# Patient Record
Sex: Female | Born: 1980 | Race: White | Hispanic: No | Marital: Single | Smoking: Former smoker
Health system: Southern US, Community
[De-identification: ages and names within clinical notes are randomized; demographics above are authoritative.]

## PROBLEM LIST (undated history)

## (undated) DIAGNOSIS — F419 Anxiety disorder, unspecified: Secondary | ICD-10-CM

## (undated) HISTORY — PX: OVARIAN CYST REMOVAL: SHX89

## (undated) HISTORY — PX: APPENDECTOMY: SHX54

---
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
## 2003-07-31 ENCOUNTER — Encounter: Admission: RE | Admit: 2003-07-31 | Discharge: 2003-07-31 | Payer: Self-pay | Admitting: Family Medicine

## 2015-03-17 ENCOUNTER — Other Ambulatory Visit: Payer: Self-pay | Admitting: Obstetrics and Gynecology

## 2015-05-28 ENCOUNTER — Other Ambulatory Visit (HOSPITAL_COMMUNITY): Payer: Self-pay | Admitting: Obstetrics and Gynecology

## 2015-05-28 DIAGNOSIS — O283 Abnormal ultrasonic finding on antenatal screening of mother: Secondary | ICD-10-CM

## 2015-05-28 DIAGNOSIS — Z3A19 19 weeks gestation of pregnancy: Secondary | ICD-10-CM

## 2015-05-28 DIAGNOSIS — Z3689 Encounter for other specified antenatal screening: Secondary | ICD-10-CM

## 2015-06-01 ENCOUNTER — Ambulatory Visit (HOSPITAL_COMMUNITY)
Admission: RE | Admit: 2015-06-01 | Discharge: 2015-06-01 | Disposition: A | Discharge status: Home / Self Care | Payer: BLUE CROSS/BLUE SHIELD | Source: Ambulatory Visit | Attending: Obstetrics and Gynecology | Admitting: Obstetrics and Gynecology

## 2015-06-01 ENCOUNTER — Encounter (HOSPITAL_COMMUNITY): Payer: Self-pay

## 2015-06-01 DIAGNOSIS — Z36 Encounter for antenatal screening of mother: Secondary | ICD-10-CM | POA: Insufficient documentation

## 2015-06-01 DIAGNOSIS — O283 Abnormal ultrasonic finding on antenatal screening of mother: Secondary | ICD-10-CM | POA: Insufficient documentation

## 2015-06-01 DIAGNOSIS — Z3A19 19 weeks gestation of pregnancy: Secondary | ICD-10-CM

## 2015-06-01 DIAGNOSIS — Z3689 Encounter for other specified antenatal screening: Secondary | ICD-10-CM

## 2015-06-01 HISTORY — DX: Anxiety disorder, unspecified: F41.9

## 2015-06-11 ENCOUNTER — Other Ambulatory Visit (HOSPITAL_COMMUNITY): Payer: Self-pay | Admitting: Obstetrics and Gynecology

## 2015-06-11 ENCOUNTER — Telehealth (HOSPITAL_COMMUNITY): Payer: Self-pay | Admitting: MS"

## 2015-06-11 DIAGNOSIS — Q6601 Congenital talipes equinovarus, right foot: Secondary | ICD-10-CM

## 2015-06-11 DIAGNOSIS — O283 Abnormal ultrasonic finding on antenatal screening of mother: Secondary | ICD-10-CM

## 2015-06-11 DIAGNOSIS — Z3A21 21 weeks gestation of pregnancy: Secondary | ICD-10-CM

## 2015-06-11 NOTE — Telephone Encounter (Signed)
Called Stacey Elliott to discuss her prenatal cell free DNA test results.  Mrs. Stacey Elliott had Panorama testing through [facility].  Testing was offered because of abnormal ultrasound results.   The patient was identified by name and DOB. We discussed that no results were able to be obtained due to borderline low fetal fraction and other analytic factors in the sample. Discussed option for redraw for NIPS and that success of obtaining result with re-draw is approximately 87% in this case. Reviewed that sample could be sent to the same lab or a different NIPS lab that uses different technology for analysis. Patient expressed concern that this would mean more waiting for results, and she doesn't think she can keep waiting. Reviewed option of amniocentesis including the benefit of it being a diagnostic test for chromosome conditions and the option of preliminary FISH results being available within 24 hours. Discussed the associated risk for complications including spontaneous pregnancy loss of approximately 1 in 300-500. Patient stated that she would like to pursue amniocentesis. Amniocentesis is currently scheduled in our [place] office for tomorrow, 2/03 at 7:30 am. She was encouraged to call back with additional questions.    Quinn Plowman, MS Patent attorney

## 2015-06-12 ENCOUNTER — Encounter (HOSPITAL_COMMUNITY): Payer: BLUE CROSS/BLUE SHIELD

## 2015-06-12 ENCOUNTER — Encounter (HOSPITAL_COMMUNITY): Payer: Self-pay

## 2015-06-12 ENCOUNTER — Ambulatory Visit (HOSPITAL_COMMUNITY)
Admission: RE | Admit: 2015-06-12 | Discharge: 2015-06-12 | Disposition: A | Discharge status: Home / Self Care | Payer: BLUE CROSS/BLUE SHIELD | Source: Ambulatory Visit | Attending: Obstetrics and Gynecology | Admitting: Obstetrics and Gynecology

## 2015-06-12 ENCOUNTER — Telehealth (HOSPITAL_COMMUNITY): Payer: Self-pay | Admitting: MS"

## 2015-06-12 DIAGNOSIS — Q6601 Congenital talipes equinovarus, right foot: Secondary | ICD-10-CM

## 2015-06-12 DIAGNOSIS — Z3A21 21 weeks gestation of pregnancy: Secondary | ICD-10-CM | POA: Insufficient documentation

## 2015-06-12 DIAGNOSIS — O283 Abnormal ultrasonic finding on antenatal screening of mother: Secondary | ICD-10-CM | POA: Diagnosis present

## 2015-06-12 LAB — ROUTINE CHROMOSOME - KARYOTYPE + FISH

## 2015-06-12 NOTE — Telephone Encounter (Signed)
Called Stacey Elliott to discuss the preliminary FISH results from her amniocentesis We reviewed that these are within normal limits.  We again discussed the limitations of FISH and that final results are still pending and will be available in 1-2 weeks.  Patient reported that she has not noticed any concerning symptoms or complications following the procedure.  All questions were answered to her satisfaction, she was encouraged to call with additional questions or concerns.  Quinn Plowman, MS Patent attorney

## 2015-06-16 ENCOUNTER — Other Ambulatory Visit (HOSPITAL_COMMUNITY): Payer: Self-pay

## 2015-06-22 ENCOUNTER — Telehealth (HOSPITAL_COMMUNITY): Payer: Self-pay | Admitting: MS"

## 2015-06-22 NOTE — Telephone Encounter (Signed)
Called Stacey Elliott to discuss the final karyotype results from her amniocentesis. We reviewed that these are within normal limits. We discussed the availability of prenatal microarray analysis. She was counseled that microarray analysis detects a genomic copy number variant (CNV) in ~6% of fetuses with an abnormal ultrasound finding(s) and a normal karyotype; however, the significance of that CNV may not be known. After thoughtful consideration of this option, she declined. All questions were answered to her satisfaction, she was encouraged to call with additional questions or concerns.  Quinn Plowman, MS Patent attorney

## 2015-06-23 ENCOUNTER — Other Ambulatory Visit (HOSPITAL_COMMUNITY): Payer: Self-pay

## 2015-06-29 ENCOUNTER — Ambulatory Visit (HOSPITAL_COMMUNITY)
Admission: RE | Admit: 2015-06-29 | Discharge: 2015-06-29 | Disposition: A | Discharge status: Home / Self Care | Payer: BLUE CROSS/BLUE SHIELD | Source: Ambulatory Visit | Attending: Obstetrics and Gynecology | Admitting: Obstetrics and Gynecology

## 2015-06-29 ENCOUNTER — Encounter (HOSPITAL_COMMUNITY): Payer: Self-pay | Admitting: Maternal and Fetal Medicine

## 2015-06-29 DIAGNOSIS — O283 Abnormal ultrasonic finding on antenatal screening of mother: Secondary | ICD-10-CM | POA: Diagnosis present

## 2015-06-29 DIAGNOSIS — Z3A23 23 weeks gestation of pregnancy: Secondary | ICD-10-CM | POA: Diagnosis not present

## 2015-06-30 ENCOUNTER — Encounter (HOSPITAL_COMMUNITY): Payer: Self-pay | Admitting: Maternal and Fetal Medicine

## 2015-08-20 ENCOUNTER — Other Ambulatory Visit (HOSPITAL_COMMUNITY): Payer: Self-pay | Admitting: Obstetrics and Gynecology

## 2015-08-20 DIAGNOSIS — O358XX0 Maternal care for other (suspected) fetal abnormality and damage, not applicable or unspecified: Principal | ICD-10-CM

## 2015-08-20 DIAGNOSIS — N320 Bladder-neck obstruction: Secondary | ICD-10-CM

## 2015-08-20 DIAGNOSIS — Z3A32 32 weeks gestation of pregnancy: Secondary | ICD-10-CM

## 2015-08-20 DIAGNOSIS — IMO0001 Reserved for inherently not codable concepts without codable children: Secondary | ICD-10-CM

## 2015-09-03 ENCOUNTER — Ambulatory Visit (HOSPITAL_COMMUNITY)
Admission: RE | Admit: 2015-09-03 | Discharge: 2015-09-03 | Disposition: A | Discharge status: Home / Self Care | Payer: BLUE CROSS/BLUE SHIELD | Source: Ambulatory Visit | Attending: Obstetrics and Gynecology | Admitting: Obstetrics and Gynecology

## 2015-09-03 ENCOUNTER — Encounter (HOSPITAL_COMMUNITY): Payer: Self-pay

## 2015-09-03 DIAGNOSIS — Z3A33 33 weeks gestation of pregnancy: Secondary | ICD-10-CM | POA: Insufficient documentation

## 2015-09-03 DIAGNOSIS — O283 Abnormal ultrasonic finding on antenatal screening of mother: Secondary | ICD-10-CM | POA: Insufficient documentation

## 2015-09-03 DIAGNOSIS — Z3A32 32 weeks gestation of pregnancy: Secondary | ICD-10-CM

## 2015-09-03 DIAGNOSIS — N320 Bladder-neck obstruction: Secondary | ICD-10-CM

## 2015-09-03 DIAGNOSIS — IMO0001 Reserved for inherently not codable concepts without codable children: Secondary | ICD-10-CM

## 2015-09-03 DIAGNOSIS — O358XX0 Maternal care for other (suspected) fetal abnormality and damage, not applicable or unspecified: Secondary | ICD-10-CM

## 2015-09-07 ENCOUNTER — Other Ambulatory Visit (HOSPITAL_COMMUNITY): Payer: Self-pay | Admitting: *Deleted

## 2015-09-07 DIAGNOSIS — IMO0001 Reserved for inherently not codable concepts without codable children: Secondary | ICD-10-CM

## 2015-09-07 DIAGNOSIS — O358XX0 Maternal care for other (suspected) fetal abnormality and damage, not applicable or unspecified: Principal | ICD-10-CM

## 2015-09-08 ENCOUNTER — Other Ambulatory Visit (HOSPITAL_COMMUNITY): Payer: Self-pay | Admitting: *Deleted

## 2015-09-08 DIAGNOSIS — O358XX0 Maternal care for other (suspected) fetal abnormality and damage, not applicable or unspecified: Secondary | ICD-10-CM

## 2015-09-08 DIAGNOSIS — O35EXX0 Maternal care for other (suspected) fetal abnormality and damage, fetal genitourinary anomalies, not applicable or unspecified: Secondary | ICD-10-CM

## 2015-09-11 ENCOUNTER — Ambulatory Visit (HOSPITAL_COMMUNITY)
Admission: RE | Admit: 2015-09-11 | Discharge: 2015-09-11 | Disposition: A | Discharge status: Home / Self Care | Payer: BLUE CROSS/BLUE SHIELD | Source: Ambulatory Visit | Attending: Obstetrics and Gynecology | Admitting: Obstetrics and Gynecology

## 2015-09-11 ENCOUNTER — Encounter (HOSPITAL_COMMUNITY): Payer: Self-pay

## 2015-09-11 ENCOUNTER — Other Ambulatory Visit (HOSPITAL_COMMUNITY): Payer: Self-pay | Admitting: Maternal and Fetal Medicine

## 2015-09-11 DIAGNOSIS — O358XX0 Maternal care for other (suspected) fetal abnormality and damage, not applicable or unspecified: Secondary | ICD-10-CM

## 2015-09-11 DIAGNOSIS — Z3A34 34 weeks gestation of pregnancy: Secondary | ICD-10-CM

## 2015-09-11 DIAGNOSIS — O283 Abnormal ultrasonic finding on antenatal screening of mother: Secondary | ICD-10-CM | POA: Diagnosis not present

## 2015-09-11 DIAGNOSIS — IMO0001 Reserved for inherently not codable concepts without codable children: Secondary | ICD-10-CM

## 2015-09-17 ENCOUNTER — Ambulatory Visit (HOSPITAL_COMMUNITY): Admission: RE | Admit: 2015-09-17 | Payer: BLUE CROSS/BLUE SHIELD | Source: Ambulatory Visit

## 2015-09-24 ENCOUNTER — Ambulatory Visit (HOSPITAL_COMMUNITY): Payer: BLUE CROSS/BLUE SHIELD

## 2015-10-01 ENCOUNTER — Ambulatory Visit (HOSPITAL_COMMUNITY): Payer: BLUE CROSS/BLUE SHIELD

## 2016-04-05 ENCOUNTER — Encounter (HOSPITAL_COMMUNITY): Payer: Self-pay

## 2017-03-01 IMAGING — US US MFM OB LIMITED
1 series · 14 of 21 positions shown · non-contrast
Comparison: none

[Series 1: us mfm ob limited · 14 of 21 slices shown]
[im 1/21]
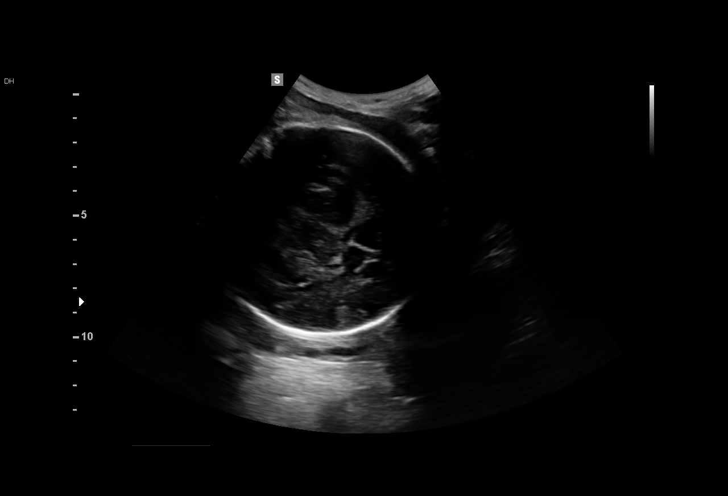
[im 3/21]
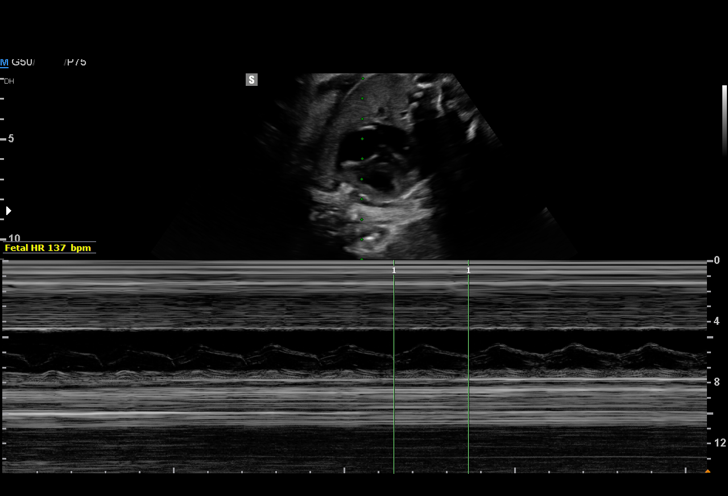
[im 4/21]
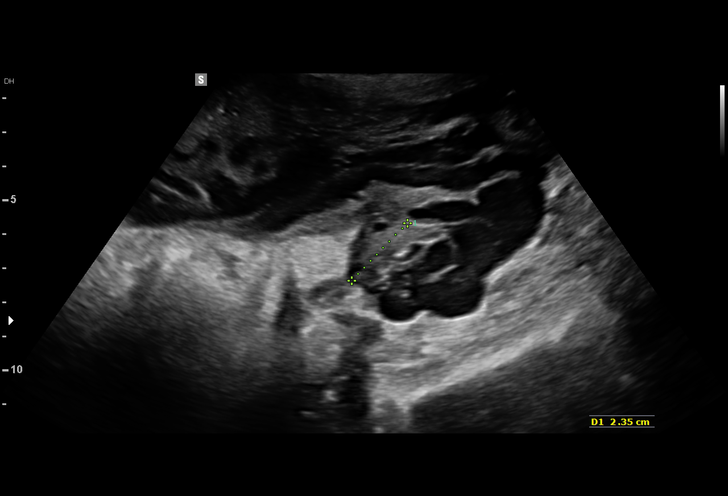
[im 6/21]
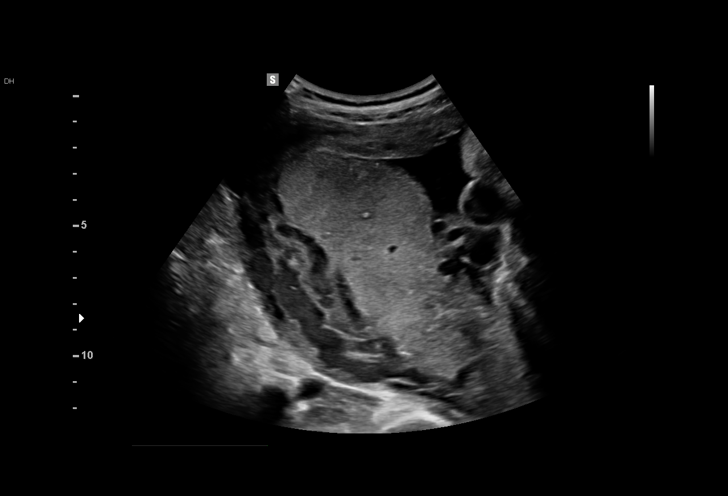
[im 7/21]
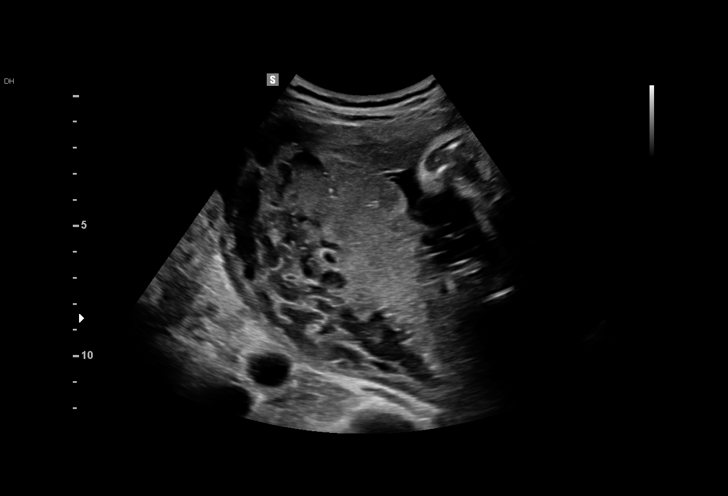
[im 9/21]
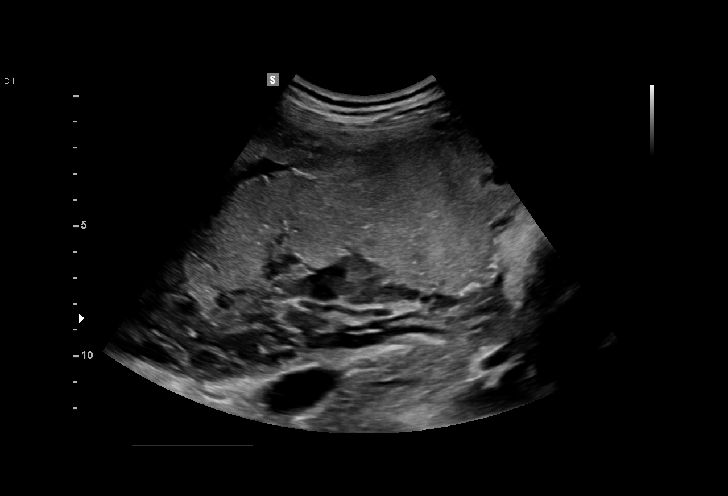
[im 10/21]
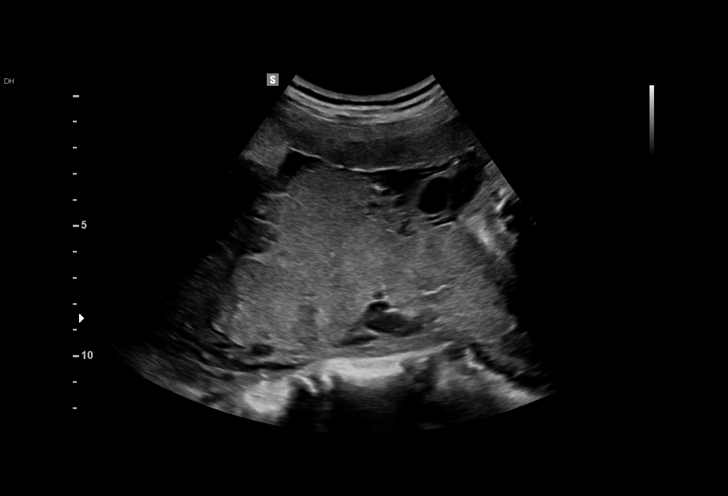
[im 12/21]
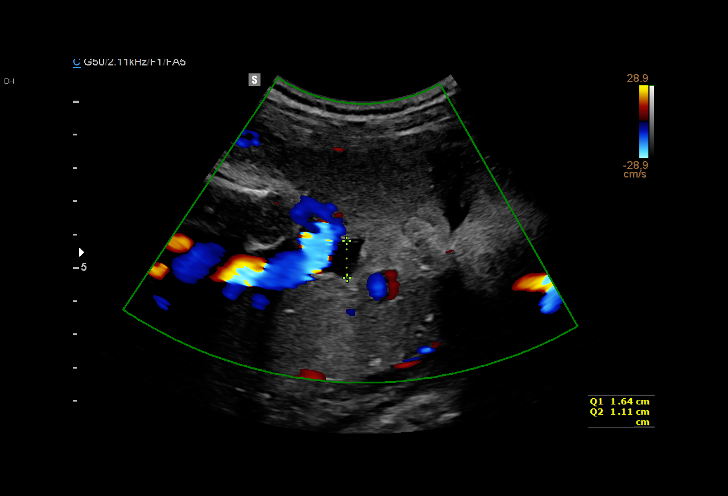
[im 13/21]
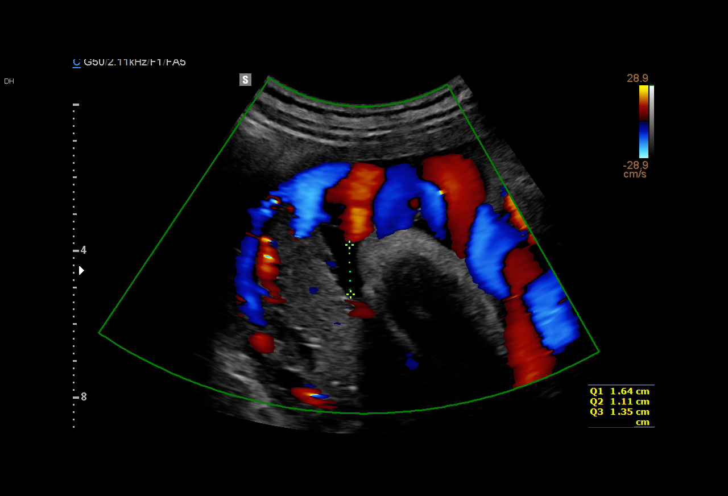
[im 15/21]
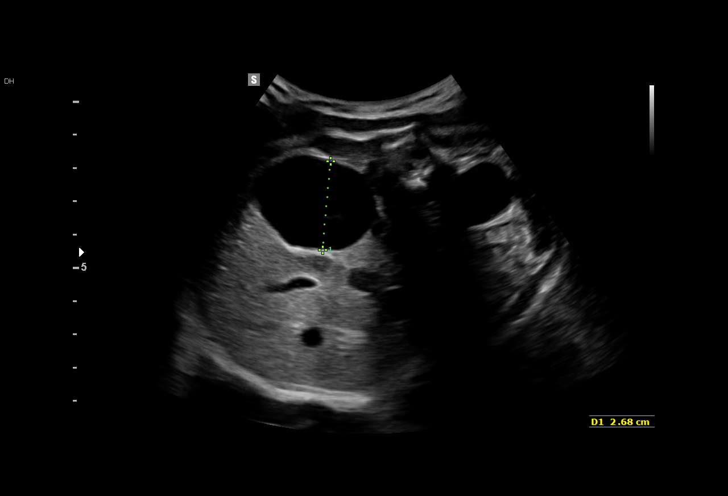
[im 16/21]
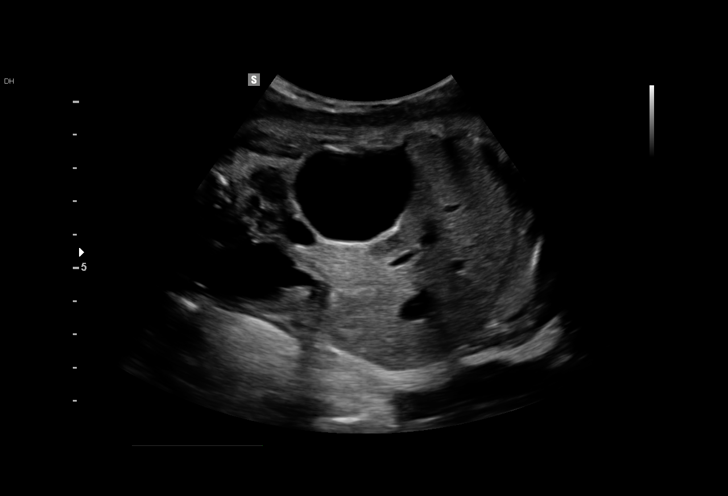
[im 18/21]
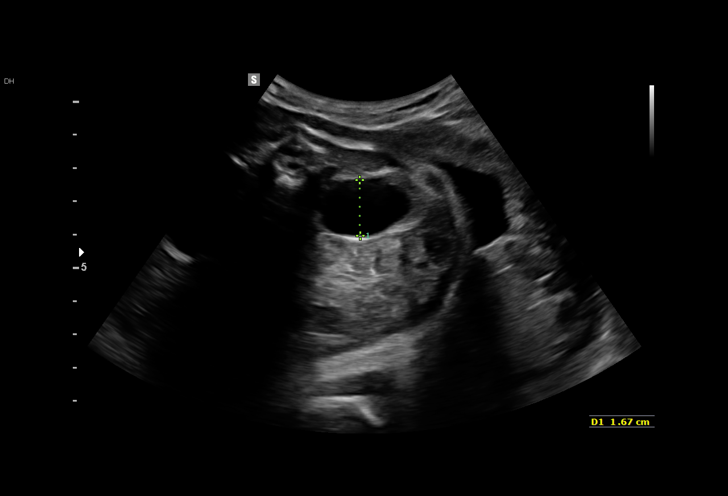
[im 19/21]
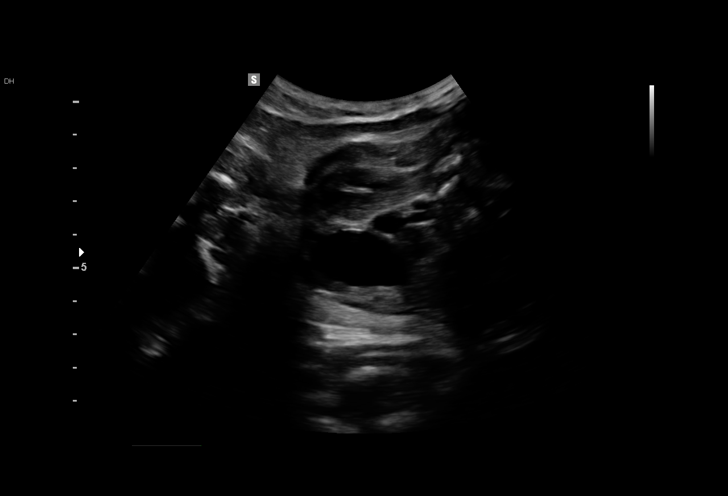
[im 21/21]
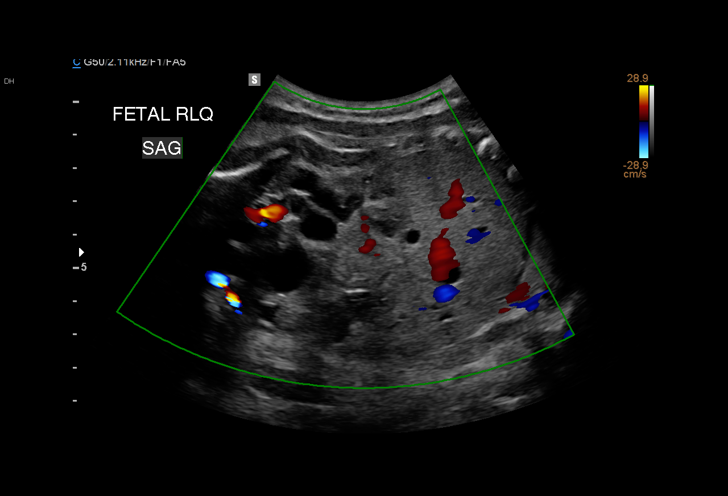

[14 of 21 positions shown; findings below may reference images not displayed]

Physicians
OB/GYN
[HOSPITAL][HOSPITAL]

1  KIAN SOON APAI            [id number]      [id number]     [id number]
Indications

34 weeks gestation of pregnancy
Abnormal fetal ultrasound - GU abnormality
and ?club foot; NIPS: no result;
amniocentesis: normal karyotype
OB History

Gravidity:    4         Term:   2        Prem:   0        SAB:   1
TOP:          0       Ectopic:  0        Living: 2
Fetal Evaluation

Num Of Fetuses:     1
Fetal Heart         137
Rate(bpm):
Cardiac Activity:   Observed
Presentation:       Cephalic
Placenta:           Posterior, above cervical os
P. Cord Insertion:  Previously Visualized

Amniotic Fluid
AFI FV:      Oligohydramnios

AFI Sum(cm)     %Tile       Largest Pocket(cm)
4.1             < 3

RUQ(cm)       RLQ(cm)       LUQ(cm)        LLQ(cm)
1.64          1.11          1.35           0
Gestational Age

LMP:           34w 1d        Date:  01/15/15                 EDD:   10/22/15
Best:          34w 1d     Det. By:  LMP  (01/15/15)          EDD:   10/22/15
Anatomy

Cranium:               Previously seen        LVOT:                   Previously seen
Cavum:                 Previously seen        Aortic Arch:            Previously seen
Ventricles:            Previously seen        Ductal Arch:            Previously seen
Choroid Plexus:        Previously seen        Diaphragm:              Previously seen
Cerebellum:            Previously seen        Stomach:                Previously Seen
Posterior Fossa:       Previously seen        Abdomen:                Previously seen
Nuchal Fold:           Previously seen        Abdominal Wall:         Previously seen
Face:                  Orbits previously      Cord Vessels:           Previously seen
seen
Lips:                  Previously seen        Kidneys:                Abnormal, see
comments
Palate:                Previously seen        Bladder:                Abnormal, see
comments
Thoracic:              Previously seen        Spine:                  Previously seen
Heart:                 Appears normal         Upper Extremities:      Previously seen
(4CH, axis, and
situs)
RVOT:                  Previously seen        Lower Extremities:      Right club foot
prev seen

Other:  Parents do not wish to know sex of fetus. Left Heel and 5th digit
previously seen.
Cervix Uterus Adnexa

Cervix
Not visualized (advanced GA >02wks)

Left Ovary
Within normal limits.

Right Ovary
Within normal limits.

Adnexa:       No abnormality visualized.
Comments

The amniotic fluid volume is aga[REDACTED]reased from last week.
This is concerning for decreased urine output related to poor
kidney function.  However, it is also possible it is a sign of
declining fetal status.  As such she will admitted for close
observation.
Impression

Single living intrauterine pregnancy at 34 weeks 1 day.
Known bilateral severe urinary tract dilation.
Oligohydramnios.
Recommendations

Admission for close monitoring given the new onset
oligohydramnios.
After discussing the possibility of admission to [REDACTED] or [facility] [HOSPITAL], she chose to be admitted
at [facility].
